# Patient Record
Sex: Female | Born: 1935 | Race: Black or African American | Hispanic: No | Marital: Married
Health system: Southern US, Community
[De-identification: ages and names within clinical notes are randomized; demographics above are authoritative.]

---

## 2005-06-16 ENCOUNTER — Ambulatory Visit: Payer: Self-pay

## 2005-06-20 ENCOUNTER — Ambulatory Visit: Payer: Self-pay

## 2006-06-21 ENCOUNTER — Emergency Department: Payer: Self-pay | Admitting: Unknown Physician Specialty

## 2008-03-07 ENCOUNTER — Ambulatory Visit: Payer: Self-pay | Admitting: Family Medicine

## 2009-05-07 IMAGING — CR RIGHT HIP - COMPLETE 2+ VIEW
1 series · 2 of 2 positions shown · non-contrast
Comparison: none

REASON FOR EXAM: hip pain
COMMENTS:

PROCEDURE:     DXR - DXR HIP RIGHT COMPLETE  - March 07, 2008 [DATE]
RESULT:     Two views of the RIGHT hip were obtained. No fracture or
dislocation is seen. No definite arthritic changes at the hip are observed.
No lytic or blastic lesions are noted.

[Series 1: view not recorded · 0.17mm/px · 2 of 2 slices shown]
[im 1/2]
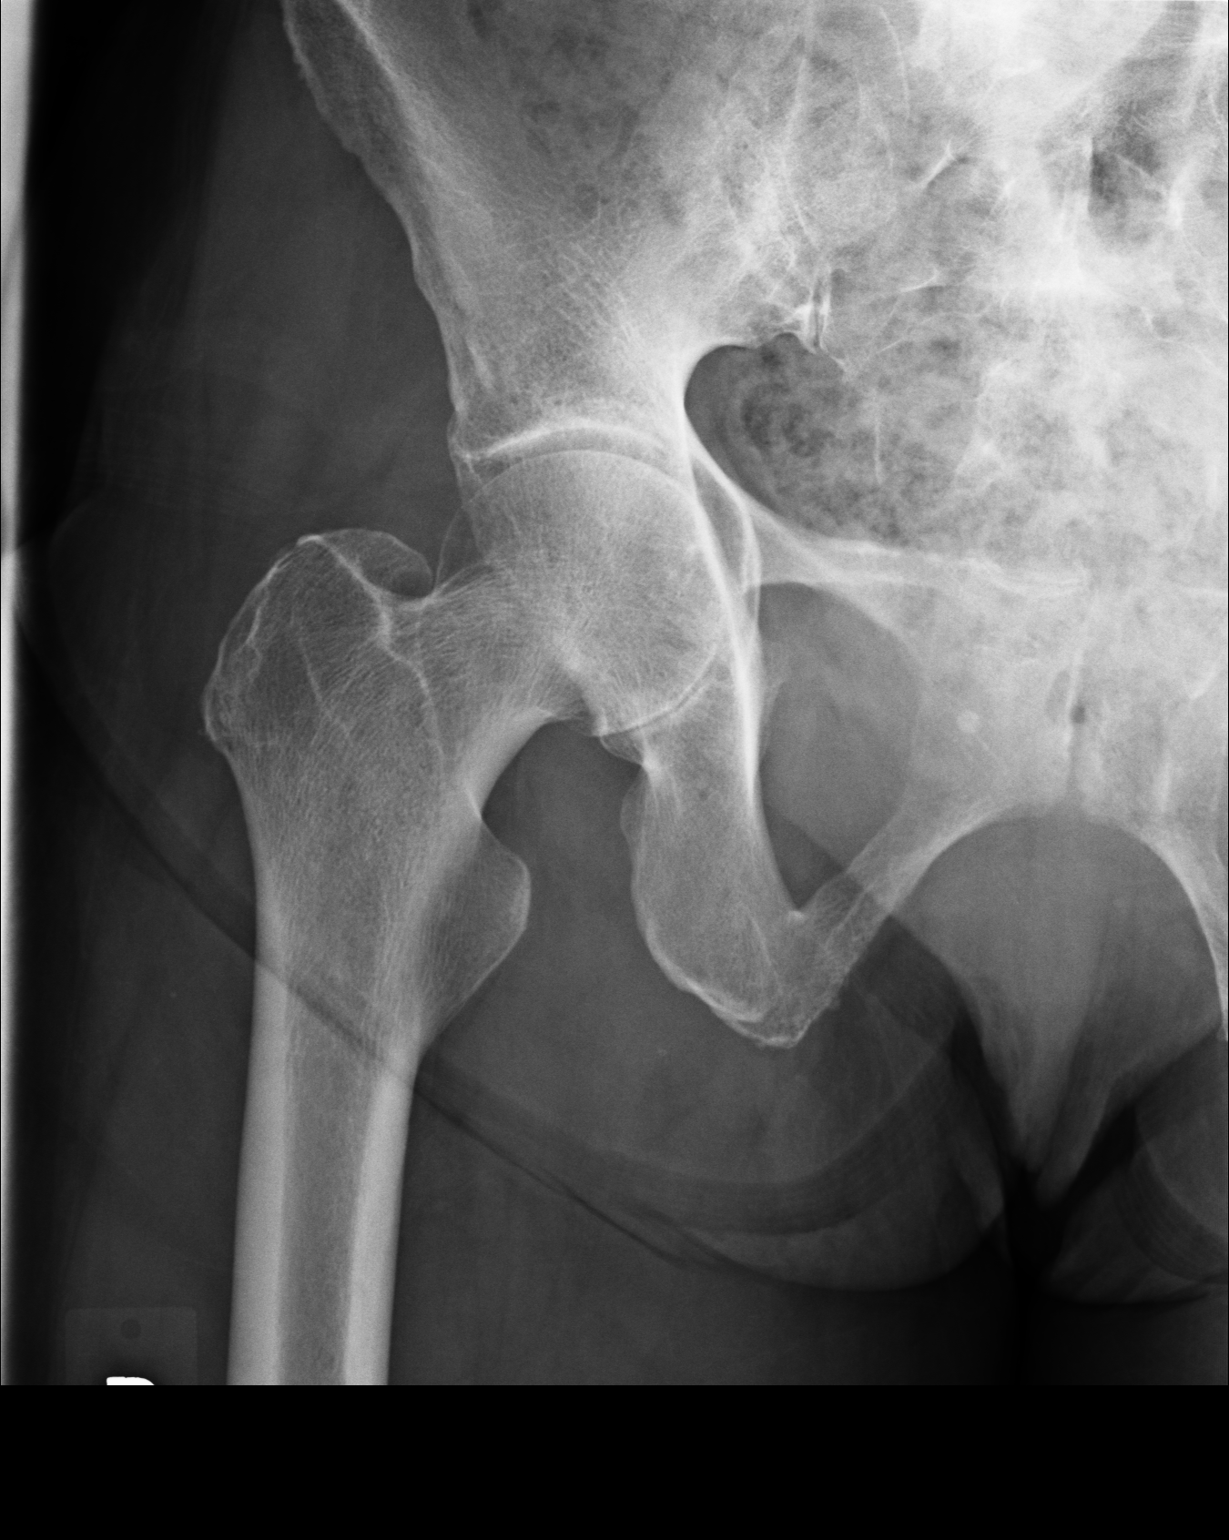
[im 2/2]
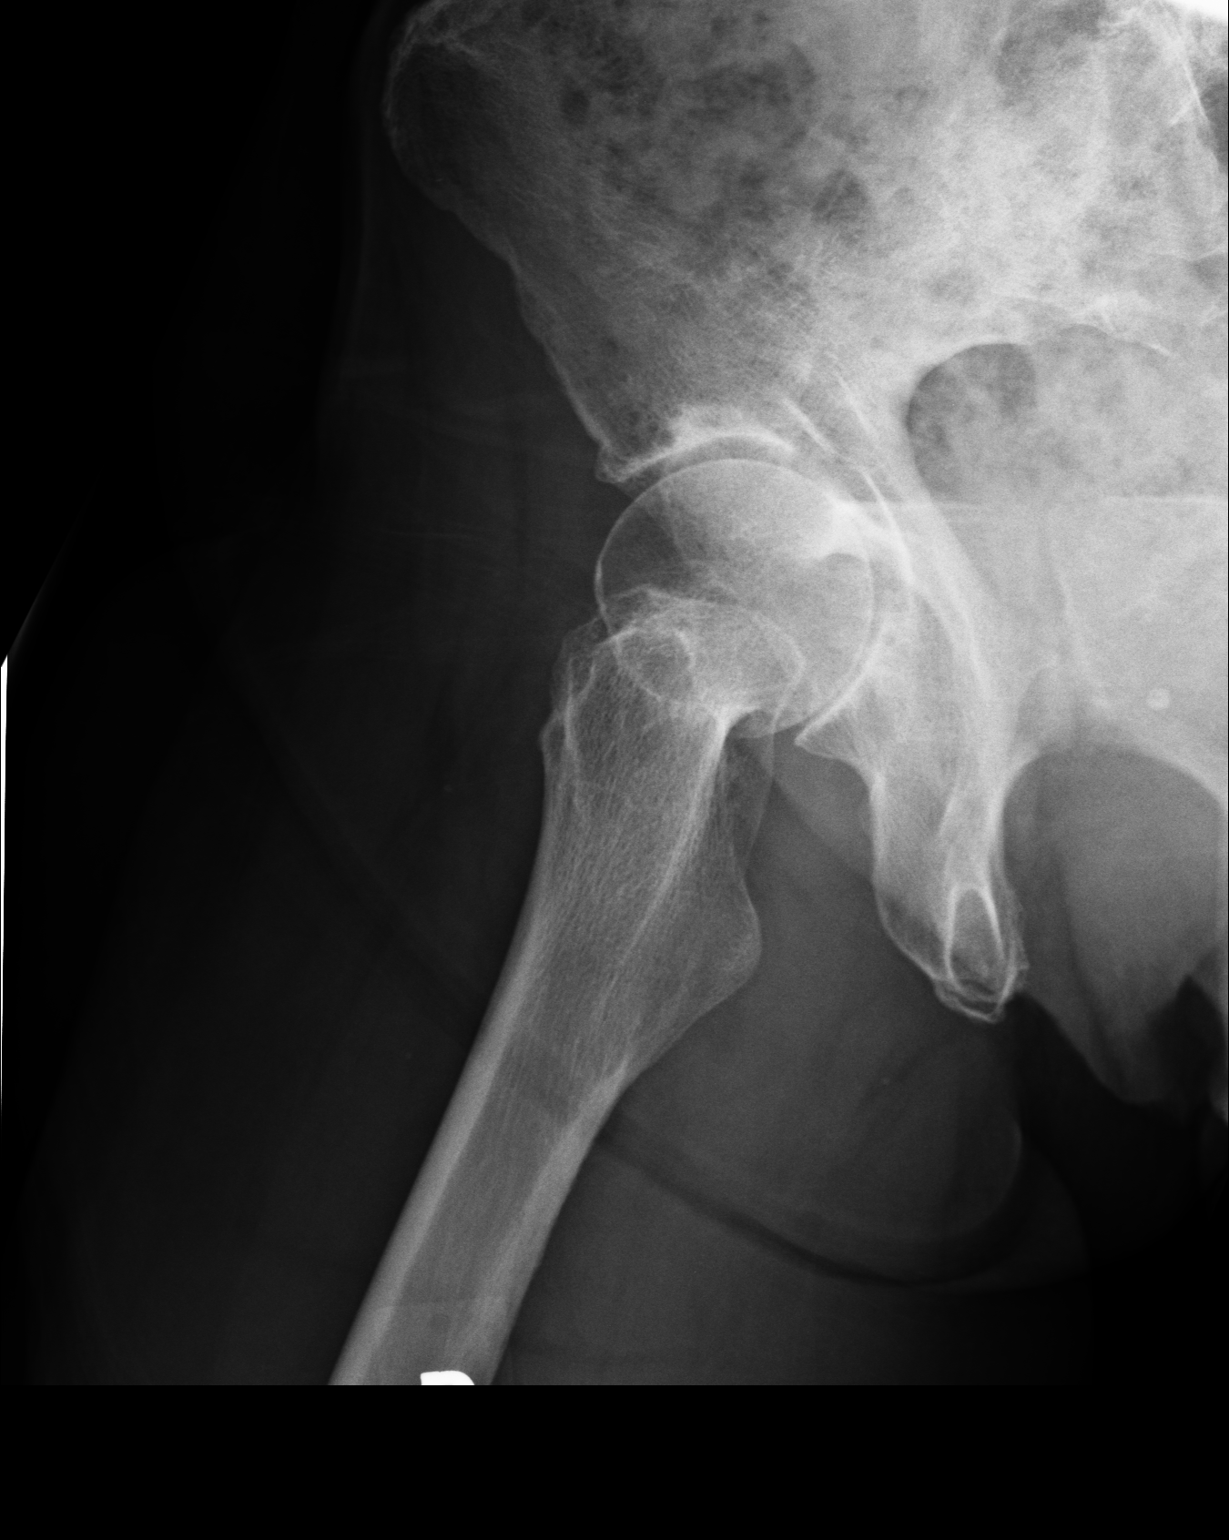

[2 of 2 positions shown; findings below may reference images not displayed]

IMPRESSION: 1.     No significant abnormalities are identified.

## 2009-05-07 IMAGING — CR PELVIS - 1-2 VIEW
1 series · 1 of 1 positions shown · non-contrast
Comparison: none

REASON FOR EXAM: pain
COMMENTS:

PROCEDURE:     DXR - DXR PELVIS AP ONLY  - March 07, 2008 [DATE]
RESULT:     An AP view of the bony pelvis shows no fracture or other acute
bony abnormality. No lytic or blastic lesions are seen.

[view not recorded]
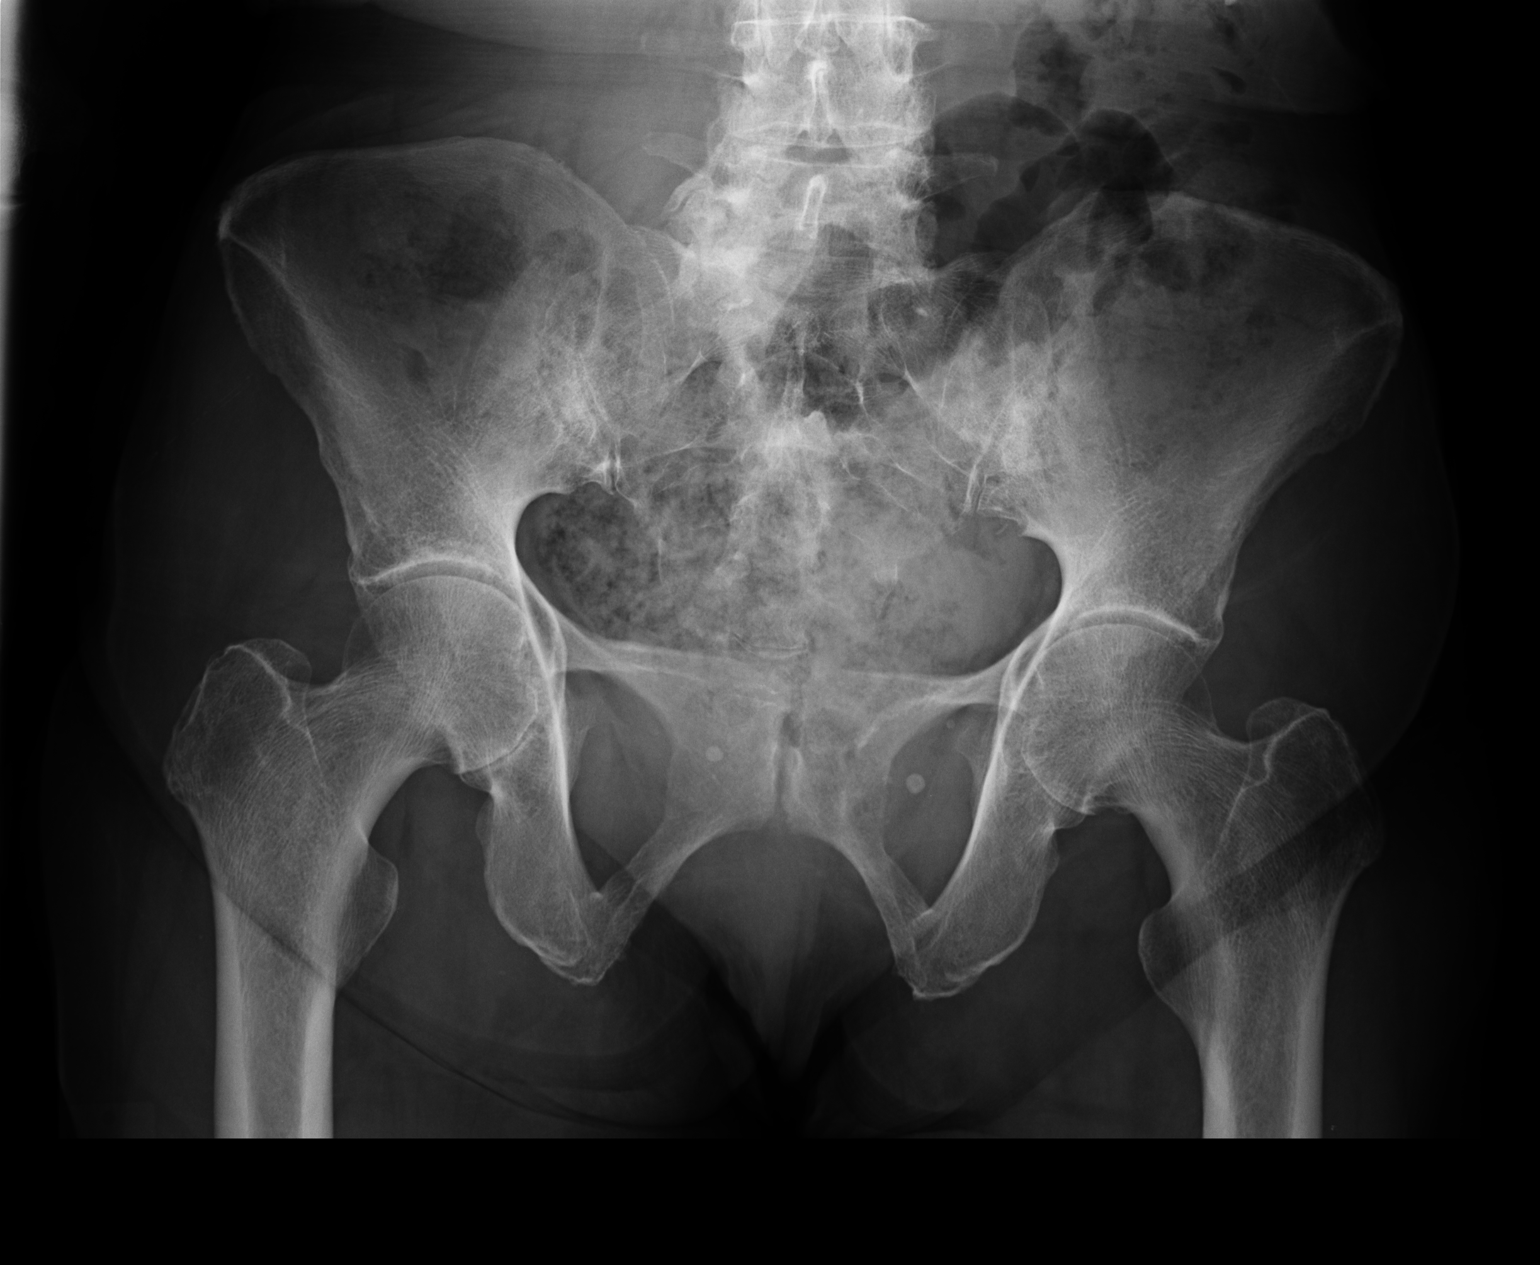

[1 of 1 positions shown; findings below may reference images not displayed]

IMPRESSION: 1.     No acute changes are identified.

## 2009-05-07 IMAGING — CR DG HIP COMPLETE 2+V*L*
1 series · 2 of 2 positions shown · non-contrast
Comparison: none

REASON FOR EXAM: hip pain
COMMENTS:

PROCEDURE:     DXR - DXR HIP LEFT COMPLETE  - March 07, 2008 [DATE]
RESULT:     Two views of the LEFT hip were obtained. No fracture or
dislocation is observed. No definite arthritic changes about the hip are
seen. No lytic or blastic lesions are noted.

[Series 1: view not recorded · 0.17mm/px · 2 of 2 slices shown]
[im 1/2]
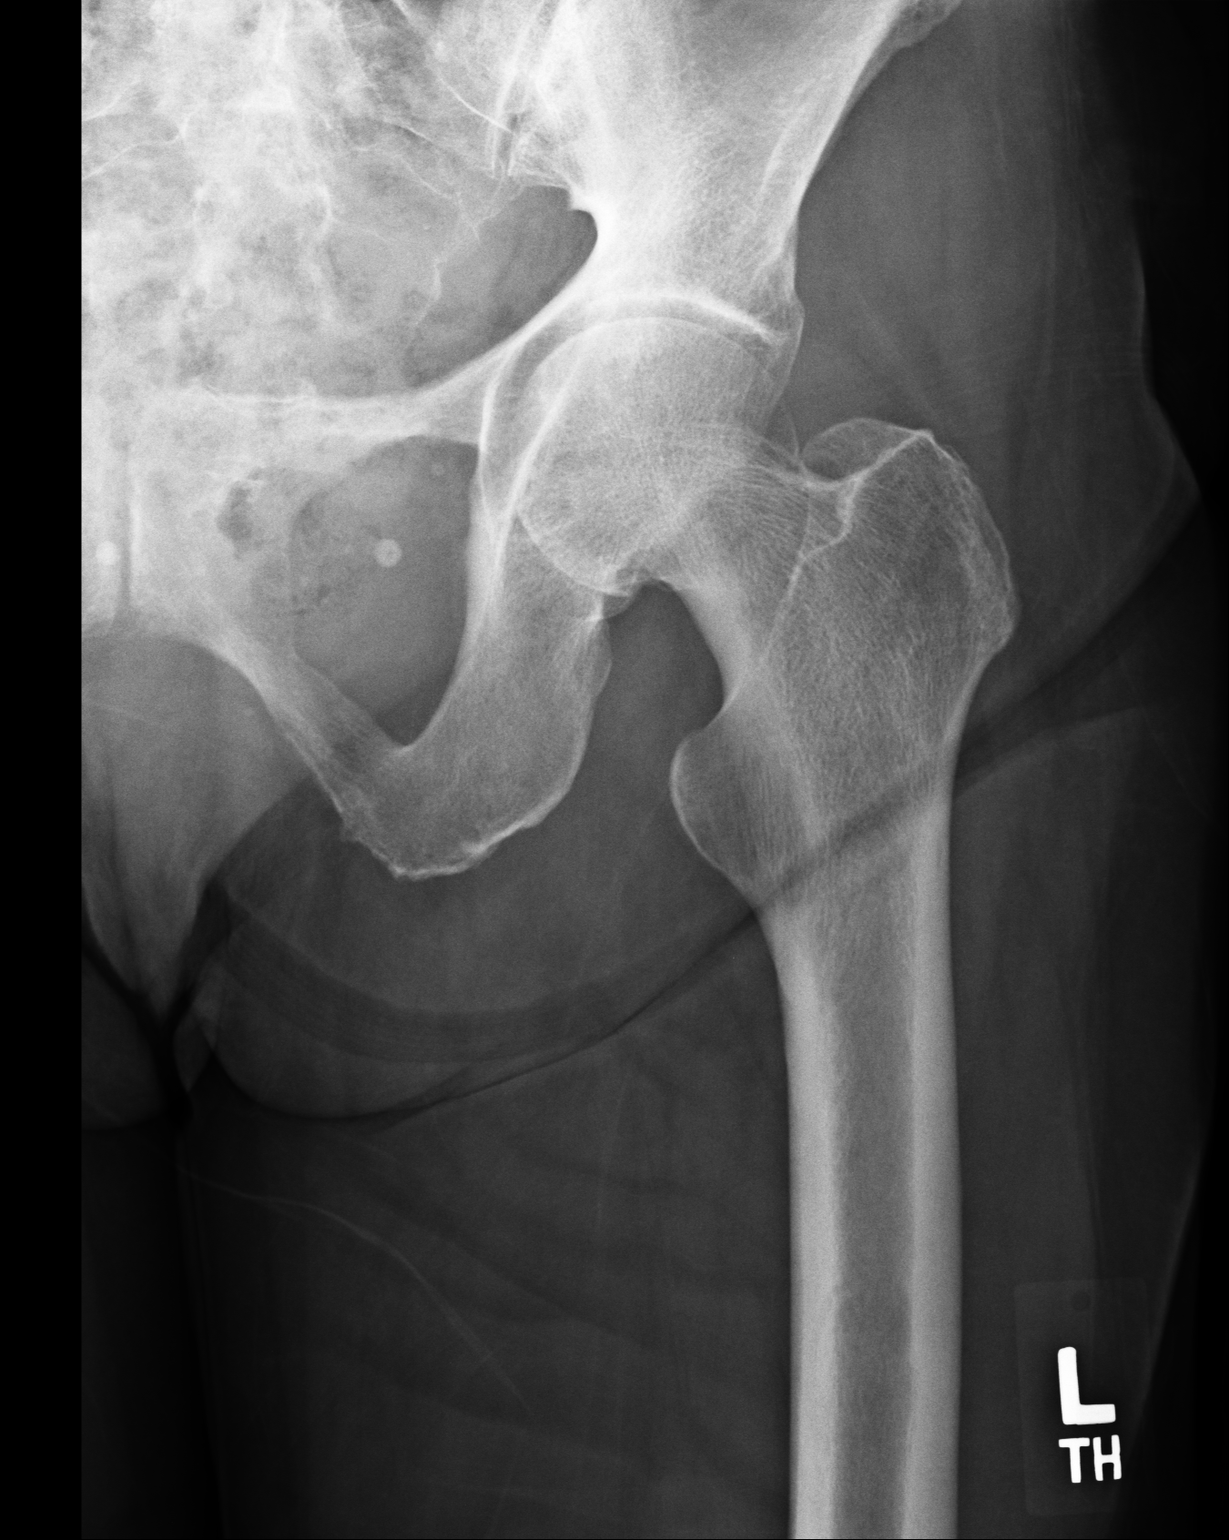
[im 2/2]
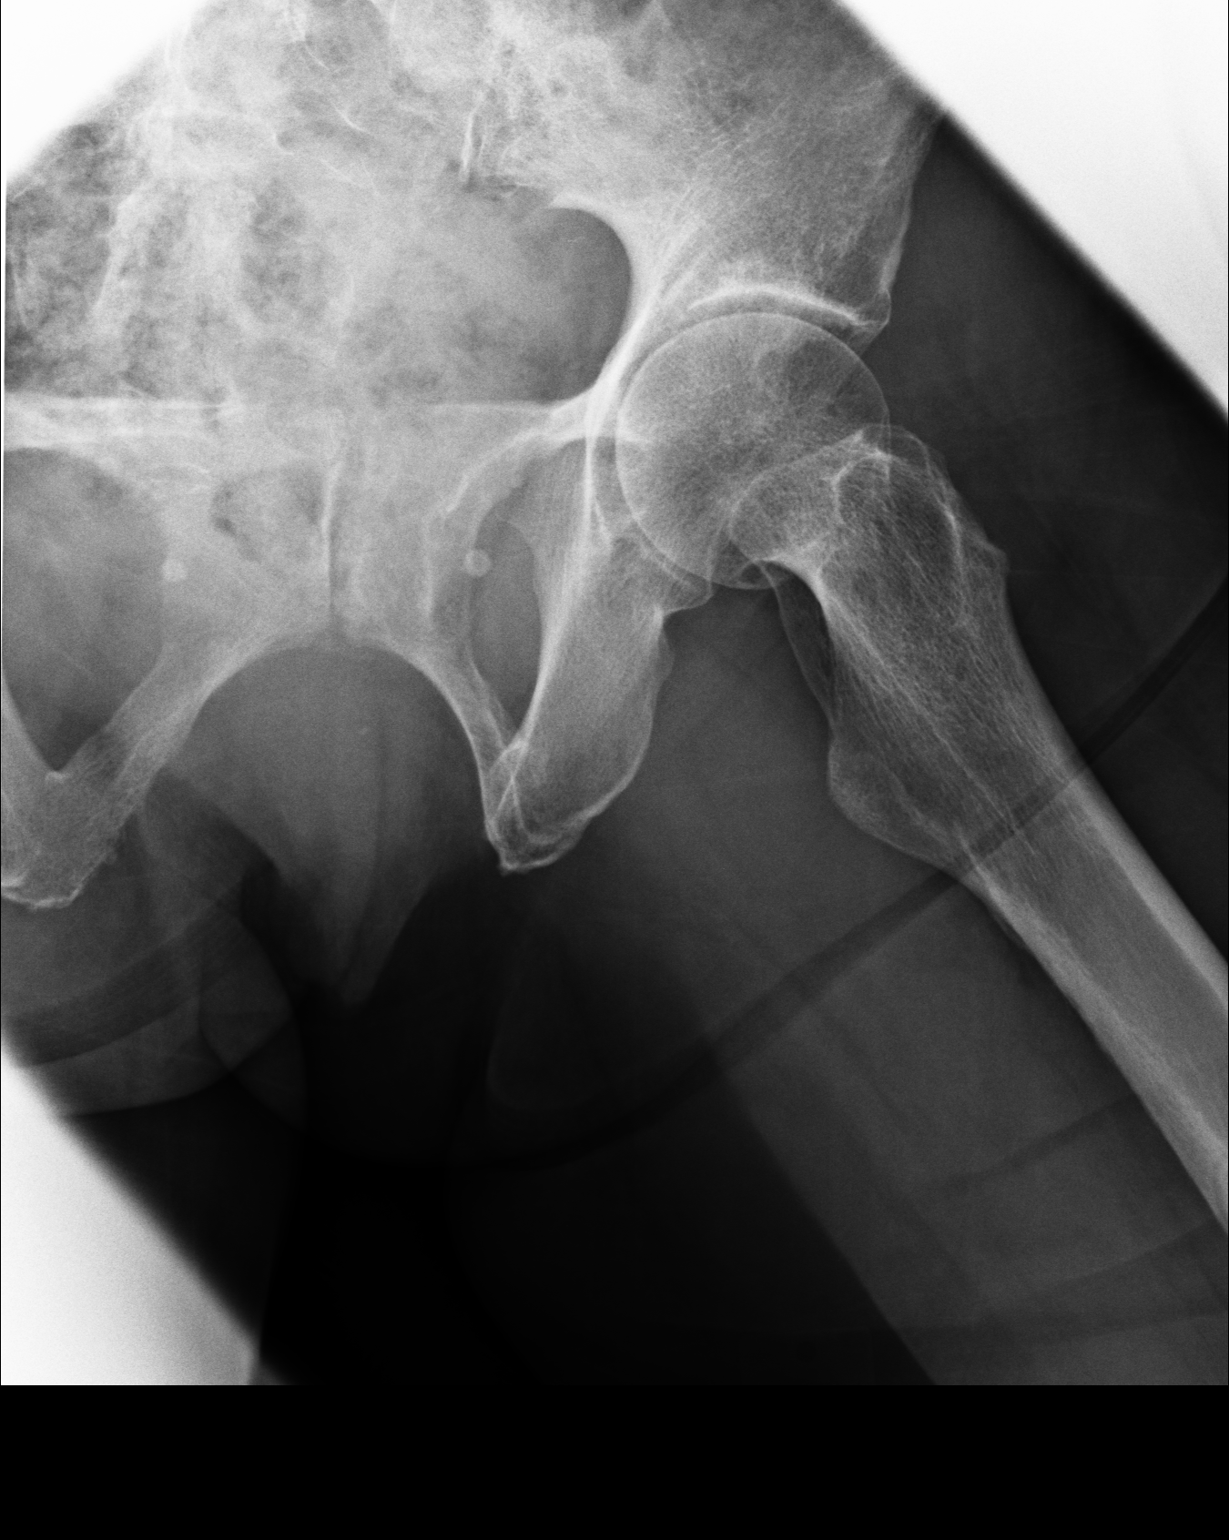

[2 of 2 positions shown; findings below may reference images not displayed]

IMPRESSION: 1.     No significant abnormalities are noted.

## 2012-09-03 ENCOUNTER — Ambulatory Visit: Payer: Self-pay | Admitting: Family Medicine

## 2012-09-04 ENCOUNTER — Ambulatory Visit: Payer: Self-pay | Admitting: General Practice

## 2012-09-04 LAB — BASIC METABOLIC PANEL
Anion Gap: 5 — ABNORMAL LOW (ref 7–16)
BUN: 16 mg/dL (ref 7–18)
Calcium, Total: 9.1 mg/dL (ref 8.5–10.1)
Chloride: 106 mmol/L (ref 98–107)
Creatinine: 0.72 mg/dL (ref 0.60–1.30)
Glucose: 91 mg/dL (ref 65–99)
Osmolality: 280 (ref 275–301)
Potassium: 4.2 mmol/L (ref 3.5–5.1)

## 2013-09-22 DEATH — deceased

## 2015-01-09 NOTE — Op Note (Signed)
PATIENT NAME:  Renee Lewis, Renee Lewis MR#:  [id number] DATE OF BIRTH:  06/25/1926  DATE OF PROCEDURE:  09/04/2012  PREOPERATIVE DIAGNOSIS:  Left radial shaft fracture.   POSTOPERATIVE DIAGNOSIS:  Left radial shaft fracture.  PROCEDURE PERFORMED: Open reduction and internal fixation of right radial shaft fracture.   SURGEON: Illene LabradorJames P. Hooten, MD  ANESTHESIA: General.   ESTIMATED BLOOD LOSS: Minimal.   FLUIDS REPLACED:  900 mL of crystalloid.  TOURNIQUET TIME:   91 minutes.   DRAINS:  None.  IMPLANTS UTILIZED:  Synthes 8-hole 3.5 mm LC-DCP plate, eight 3.5 mm cortical screws.   INDICATIONS FOR SURGERY:  The patient is an 81 year old right-hand dominant female who has significant history of dementia. She apparently fell and sustained a fracture of the left radial shaft. After discussion of the risks and benefits of surgical intervention with the patient's son, he expressed his understanding of the risks, benefits, and agreed with plans for surgical intervention.   PROCEDURE IN DETAIL:  The patient was brought into the Operating Room and, after adequate general anesthesia was achieved, a tourniquet was placed on the patient's upper left arm. The patient's left hand and arm were cleaned and prepped with alcohol and DuraPrep and draped in the usual sterile fashion. A "timeout" was performed as per usual protocol. The left upper extremity was loosely exsanguinated using an Esmarch, and the tourniquet was inflated to 250 mmHg. The fracture site was identified and served as the central point of the incision along the volar surface of the forearm. The radial artery was identified and protected throughout the procedure. The interval between the brachial radialis and flexor carpi radialis was further developed, taking care to protect the superficial branch of the radial nerve. The forearm was placed in supination so as to protect the posterior interosseous nerve and the medial border of the supinator was  elevated off of the radial shaft. Dissection was continued and the pronator teres was elevated off the radial shaft with the forearm placed in pronation. The fracture site was identified and reduced. Reduction was maintained using bone reduction forceps with points. Due to the size of bone, it was felt that a 4.5 mm plate was too large, and it was elected to proceed with placement of a 3.5 mm LC-DCP plate. An 8-hole plate was selected and bent so as to fit the contour of the volar surface of the radius. The plate was then secured with screws using compression technique with the second screw. A total of eight 3.5 mm screws were utilized. Position of the hardware and reduction was visualized in multiple planes using the FluoroScan and felt to be acceptable.   The wound was irrigated with copious amounts of normal saline with antibiotic solution. The tourniquet was deflated after total tourniquet time of 91 minutes. Good hemostasis was appreciated. The wound was closed in layers using first #0 Vicryl followed by 2-0 Vicryl. The skin was reapproximated using a running subcuticular suture of 3-0 Vicryl. Steri-Strips were applied. A sterile dressing was applied followed by application of a posterior splint.   The patient tolerated the procedure well. She was transported to the recovery room in stable condition.     ____________________________ Illene LabradorJames P. Angie FavaHooten Jr., MD jph:ct D: 09/04/2012 17:06:00 ET T: 09/05/2012 14:36:44 ET JOB#: [id number]  cc: Fayrene FearingJames P. Angie FavaHooten Jr., MD, <Dictator> JAMES P Angie FavaHOOTEN JR MD ELECTRONICALLY SIGNED 09/05/2012 20:48
